# Patient Record
Sex: Female | Born: 1970
Health system: Northeastern US, Academic
[De-identification: ages and names within clinical notes are randomized; demographics above are authoritative.]

## PROBLEM LIST (undated history)

## (undated) DIAGNOSIS — S22059A Unspecified fracture of T5-T6 vertebra, initial encounter for closed fracture: Secondary | ICD-10-CM

## (undated) HISTORY — PX: KNEE SURGERY: SHX244

---

## 2016-06-01 ENCOUNTER — Encounter (INDEPENDENT_AMBULATORY_CARE_PROVIDER_SITE_OTHER): Payer: Self-pay

## 2016-06-01 ENCOUNTER — Other Ambulatory Visit: Payer: Self-pay | Admitting: Chiropractor

## 2016-06-01 ENCOUNTER — Ambulatory Visit
Admission: RE | Admit: 2016-06-01 | Discharge: 2016-06-01 | Disposition: A | Discharge status: Home / Self Care | Payer: No Typology Code available for payment source | Source: Ambulatory Visit | Attending: Chiropractor | Admitting: Chiropractor

## 2016-06-01 DIAGNOSIS — S060X9A Concussion with loss of consciousness of unspecified duration, initial encounter: Secondary | ICD-10-CM | POA: Insufficient documentation

## 2016-06-01 DIAGNOSIS — R413 Other amnesia: Secondary | ICD-10-CM | POA: Insufficient documentation

## 2016-06-01 DIAGNOSIS — H9313 Tinnitus, bilateral: Secondary | ICD-10-CM | POA: Diagnosis not present

## 2016-06-13 ENCOUNTER — Emergency Department
Admission: EM | Admit: 2016-06-13 | Discharge: 2016-06-13 | Disposition: A | Discharge status: Home / Self Care | Payer: Self-pay | Attending: Emergency Medicine | Admitting: Emergency Medicine

## 2016-06-13 ENCOUNTER — Encounter: Payer: Self-pay | Admitting: Emergency Medicine

## 2016-06-13 DIAGNOSIS — F17200 Nicotine dependence, unspecified, uncomplicated: Secondary | ICD-10-CM | POA: Insufficient documentation

## 2016-06-13 DIAGNOSIS — N921 Excessive and frequent menstruation with irregular cycle: Secondary | ICD-10-CM | POA: Insufficient documentation

## 2016-06-13 HISTORY — DX: Unspecified fracture of t5-T6 vertebra, initial encounter for closed fracture: S22.059A

## 2016-06-13 LAB — CBC WITH DIFFERENTIAL/PLATELET
BASOS PCT: 1 %
Basophils Absolute: 0 10*3/uL (ref 0–0.1)
Eosinophils Absolute: 0.2 10*3/uL (ref 0–0.7)
Eosinophils Relative: 3 %
HEMATOCRIT: 36 % (ref 35.0–47.0)
Hemoglobin: 12.2 g/dL (ref 12.0–16.0)
Lymphocytes Relative: 36 %
Lymphs Abs: 2.1 10*3/uL (ref 1.0–3.6)
MCH: 27 pg (ref 26.0–34.0)
MCHC: 33.8 g/dL (ref 32.0–36.0)
MCV: 80 fL (ref 80.0–100.0)
MONO ABS: 0.3 10*3/uL (ref 0.2–0.9)
MONOS PCT: 5 %
NEUTROS ABS: 3.1 10*3/uL (ref 1.4–6.5)
Neutrophils Relative %: 55 %
Platelets: 234 10*3/uL (ref 150–440)
RBC: 4.5 MIL/uL (ref 3.80–5.20)
RDW: 18.4 % — AB (ref 11.5–14.5)
WBC: 5.7 10*3/uL (ref 3.6–11.0)

## 2016-06-13 LAB — URINALYSIS COMPLETE WITH MICROSCOPIC (ARMC ONLY)
BILIRUBIN URINE: NEGATIVE
Bacteria, UA: NONE SEEN
Glucose, UA: NEGATIVE mg/dL
KETONES UR: NEGATIVE mg/dL
LEUKOCYTES UA: NEGATIVE
NITRITE: NEGATIVE
PH: 5 (ref 5.0–8.0)
Protein, ur: 100 mg/dL — AB
Specific Gravity, Urine: 1.024 (ref 1.005–1.030)
Squamous Epithelial / LPF: NONE SEEN

## 2016-06-13 LAB — POCT PREGNANCY, URINE: Preg Test, Ur: NEGATIVE

## 2016-06-13 LAB — VITAMIN B12: Vitamin B-12: 746 pg/mL (ref 180–914)

## 2016-06-13 LAB — FOLATE: FOLATE: 10.9 ng/mL (ref >5.9)

## 2016-06-13 LAB — FERRITIN: FERRITIN: 4 ng/mL — AB (ref 11–307)

## 2016-06-13 LAB — IRON AND TIBC
Iron: 15 ug/dL — ABNORMAL LOW (ref 28–170)
Saturation Ratios: 3 % — ABNORMAL LOW (ref 10.4–31.8)
TIBC: 517 ug/dL — ABNORMAL HIGH (ref 250–450)
UIBC: 502 ug/dL

## 2016-06-13 MED ORDER — NORGESTIMATE-ETH ESTRADIOL 0.25-35 MG-MCG PO TABS
1.0000 | ORAL_TABLET | Freq: Every day | ORAL | 1 refills | Status: AC
Start: 2016-06-13 — End: ?
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
## 2016-06-13 NOTE — Discharge Instructions (Signed)
As we discussed, your workup was reassuring today.  We understand your preference to not start taking birth control pills and we encourage you not to do so unless the bleeding is affecting your daily life, particularly because together with your tobacco use, the birth control pills may increase your risk of developing a blood clot in your legs or lungs.  We recommend that you follow-up with an OB/GYN such as Dr. Jean RosenthalJackson or one of his colleagues at the next available opportunity to establish care and determine if further workup is required.  To help with the outpatient follow-up, we sent off blood work today including a complete blood count and iron studies; the results should be available when you follow-up.  Return to the emergency department if you develop new or worsening symptoms that concern you.

## 2016-06-13 NOTE — ED Notes (Signed)
Pt discharged home after verbalizing understanding of discharge instructions; nad noted. 

## 2016-06-16 ENCOUNTER — Telehealth: Payer: Self-pay | Admitting: Emergency Medicine

## 2016-06-16 NOTE — Telephone Encounter (Signed)
Called patient to tell her the iron studies that were done on ED visit were complete. I told her they wer not in normal range , but we have no prior values to compare.   She does not know past values, and she has had blood work done in [place] prior.   She is established with [facility].  I told her she could call [facility] and let them know about the labs done here.  She says she is going to [place] in couple weeks and may see her old doctor there.  I told her she could always pick up a copy of her labs from medical records.

## 2016-09-30 ENCOUNTER — Emergency Department
Admission: EM | Admit: 2016-09-30 | Discharge: 2016-09-30 | Disposition: A | Discharge status: Home / Self Care | Payer: No Typology Code available for payment source | Attending: Emergency Medicine | Admitting: Emergency Medicine

## 2016-09-30 ENCOUNTER — Emergency Department: Payer: No Typology Code available for payment source

## 2016-09-30 DIAGNOSIS — R51 Headache: Secondary | ICD-10-CM

## 2016-09-30 DIAGNOSIS — G43009 Migraine without aura, not intractable, without status migrainosus: Secondary | ICD-10-CM

## 2016-09-30 DIAGNOSIS — G43109 Migraine with aura, not intractable, without status migrainosus: Secondary | ICD-10-CM

## 2016-09-30 DIAGNOSIS — Z5181 Encounter for therapeutic drug level monitoring: Secondary | ICD-10-CM | POA: Insufficient documentation

## 2016-09-30 DIAGNOSIS — F17200 Nicotine dependence, unspecified, uncomplicated: Secondary | ICD-10-CM | POA: Diagnosis not present

## 2016-09-30 DIAGNOSIS — R519 Headache, unspecified: Secondary | ICD-10-CM

## 2016-09-30 DIAGNOSIS — H539 Unspecified visual disturbance: Secondary | ICD-10-CM

## 2016-09-30 LAB — APTT: APTT: 25 s (ref 24–36)

## 2016-09-30 LAB — DIFFERENTIAL
Basophils Absolute: 0 10*3/uL (ref 0–0.1)
Basophils Relative: 1 %
EOS ABS: 0.2 10*3/uL (ref 0–0.7)
EOS PCT: 4 %
Lymphocytes Relative: 42 %
Lymphs Abs: 1.9 10*3/uL (ref 1.0–3.6)
Monocytes Absolute: 0.3 10*3/uL (ref 0.2–0.9)
Monocytes Relative: 6 %
NEUTROS PCT: 47 %
Neutro Abs: 2.1 10*3/uL (ref 1.4–6.5)

## 2016-09-30 LAB — COMPREHENSIVE METABOLIC PANEL
ALBUMIN: 4 g/dL (ref 3.5–5.0)
ALT: 16 U/L (ref 14–54)
ANION GAP: 5 (ref 5–15)
AST: 23 U/L (ref 15–41)
Alkaline Phosphatase: 58 U/L (ref 38–126)
BUN: 11 mg/dL (ref 6–20)
CO2: 25 mmol/L (ref 22–32)
Calcium: 8.8 mg/dL — ABNORMAL LOW (ref 8.9–10.3)
Chloride: 107 mmol/L (ref 101–111)
Creatinine, Ser: 0.84 mg/dL (ref 0.44–1.00)
GFR calc Af Amer: >60 mL/min (ref >60)
GFR calc non Af Amer: >60 mL/min (ref >60)
GLUCOSE: 82 mg/dL (ref 65–99)
POTASSIUM: 3.6 mmol/L (ref 3.5–5.1)
Sodium: 137 mmol/L (ref 135–145)
TOTAL PROTEIN: 7.2 g/dL (ref 6.5–8.1)
Total Bilirubin: 0.2 mg/dL — ABNORMAL LOW (ref 0.3–1.2)

## 2016-09-30 LAB — GLUCOSE, CAPILLARY: Glucose-Capillary: 98 mg/dL (ref 65–99)

## 2016-09-30 LAB — CBC
HEMATOCRIT: 32.6 % — AB (ref 35.0–47.0)
HEMOGLOBIN: 11 g/dL — AB (ref 12.0–16.0)
MCH: 26.2 pg (ref 26.0–34.0)
MCHC: 33.6 g/dL (ref 32.0–36.0)
MCV: 77.8 fL — ABNORMAL LOW (ref 80.0–100.0)
Platelets: 242 10*3/uL (ref 150–440)
RBC: 4.19 MIL/uL (ref 3.80–5.20)
RDW: 17.1 % — AB (ref 11.5–14.5)
WBC: 4.4 10*3/uL (ref 3.6–11.0)

## 2016-09-30 LAB — PROTIME-INR
INR: 0.9
Prothrombin Time: 12.1 seconds (ref 11.4–15.2)

## 2016-09-30 LAB — TROPONIN I: Troponin I: <0.03 ng/mL (ref <0.03)

## 2016-09-30 MED ORDER — LORAZEPAM 2 MG/ML IJ SOLN
0.5000 mg | Freq: Once | INTRAMUSCULAR | Status: AC
  Administered 2016-09-30: 0.5 mg via INTRAVENOUS
  Filled 2016-09-30: qty 1

## 2016-09-30 MED ORDER — METOCLOPRAMIDE HCL 5 MG/ML IJ SOLN
10.0000 mg | Freq: Once | INTRAMUSCULAR | Status: AC
  Administered 2016-09-30: 10 mg via INTRAVENOUS
  Filled 2016-09-30: qty 2

## 2016-09-30 MED ORDER — KETOROLAC TROMETHAMINE 30 MG/ML IJ SOLN
15.0000 mg | Freq: Once | INTRAMUSCULAR | Status: AC
  Administered 2016-09-30: 15 mg via INTRAVENOUS
  Filled 2016-09-30: qty 1

## 2016-09-30 MED ORDER — METOCLOPRAMIDE HCL 10 MG PO TABS: 10.0000 mg | ORAL_TABLET | Freq: Three times a day (TID) | ORAL | 1 refills | Status: AC | PRN

## 2016-09-30 MED ORDER — SODIUM CHLORIDE 0.9 % IV BOLUS (SEPSIS)
1000.0000 mL | Freq: Once | INTRAVENOUS | Status: AC
  Administered 2016-09-30: 1000 mL via INTRAVENOUS

## 2016-09-30 MED ORDER — IBUPROFEN 800 MG PO TABS: 800.0000 mg | ORAL_TABLET | Freq: Three times a day (TID) | ORAL | 0 refills | Status: AC | PRN
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
## 2016-09-30 NOTE — Discharge Instructions (Signed)
Please drink plenty of fluids and continue with multivitamins etc. Please follow-up with your primary physician for possible referral as an outpatient to a neurologist. Return to emergency department especially for focal weakness, uncontrolled vomiting, fever, or any new concerns.   Please return immediately if condition worsens. Please contact her primary physician or the physician you were given for referral. If you have any specialist physicians involved in her treatment and plan please also contact them. Thank you for using [place] regional emergency Department.

## 2016-09-30 NOTE — Progress Notes (Signed)
Chaplain received a code stroke page. When Chaplain arrived the patient was still at CT but she arrived a few minutes later. The medical team needed some space to treat the patient. Chaplain left and returned for a follow up. Chaplain returned and was told by patient and her friend that the medical test proved that it was not a stroke. Patient hopes to be discharged today.

## 2016-12-03 ENCOUNTER — Emergency Department
Admission: EM | Admit: 2016-12-03 | Discharge: 2016-12-03 | Disposition: A | Discharge status: Home / Self Care | Payer: Self-pay | Attending: Emergency Medicine | Admitting: Emergency Medicine

## 2016-12-03 ENCOUNTER — Encounter: Payer: Self-pay | Admitting: Emergency Medicine

## 2016-12-03 ENCOUNTER — Emergency Department: Payer: Self-pay

## 2016-12-03 DIAGNOSIS — J157 Pneumonia due to Mycoplasma pneumoniae: Secondary | ICD-10-CM | POA: Insufficient documentation

## 2016-12-03 DIAGNOSIS — F17200 Nicotine dependence, unspecified, uncomplicated: Secondary | ICD-10-CM | POA: Insufficient documentation

## 2016-12-03 DIAGNOSIS — H6992 Unspecified Eustachian tube disorder, left ear: Secondary | ICD-10-CM | POA: Insufficient documentation

## 2016-12-03 DIAGNOSIS — H6982 Other specified disorders of Eustachian tube, left ear: Secondary | ICD-10-CM

## 2016-12-03 DIAGNOSIS — Z791 Long term (current) use of non-steroidal anti-inflammatories (NSAID): Secondary | ICD-10-CM | POA: Insufficient documentation

## 2016-12-03 DIAGNOSIS — Z79899 Other long term (current) drug therapy: Secondary | ICD-10-CM | POA: Insufficient documentation

## 2016-12-03 DIAGNOSIS — J0100 Acute maxillary sinusitis, unspecified: Secondary | ICD-10-CM | POA: Insufficient documentation

## 2016-12-03 MED ORDER — CETIRIZINE HCL 10 MG PO TABS: 10.0000 mg | ORAL_TABLET | Freq: Every day | ORAL | 0 refills | Status: AC

## 2016-12-03 MED ORDER — FLUTICASONE PROPIONATE 50 MCG/ACT NA SUSP: 1.0000 | Freq: Two times a day (BID) | NASAL | 0 refills | Status: AC

## 2016-12-03 MED ORDER — DOXYCYCLINE HYCLATE 100 MG PO TABS: 100.0000 mg | ORAL_TABLET | Freq: Two times a day (BID) | ORAL | 0 refills | Status: AC

## 2016-12-03 NOTE — ED Triage Notes (Signed)
Pt reports productive cough for one month. Pt states she coughs so much she feels a burning in her chest. Pt reports bilateral ear pain, nasal congestion and sore throat for one week.

## 2016-12-03 NOTE — ED Notes (Signed)
Patient is having a hard time hearing out of left ear. Coughing so much she vomits at times.

## 2016-12-03 NOTE — ED Notes (Signed)
Pt. Going home with family. 

## 2016-12-03 NOTE — ED Provider Notes (Signed)
[facility] Emergency Department Provider Note  ____________________________________________  Time seen: Approximately 7:41 PM  I have reviewed the triage vital signs and the nursing notes.   HISTORY  Chief Complaint Cough and Otalgia    HPI Kelli Woodard is a 46 y.o. female who presents emergency department complaining of a month history of productive cough and one-week history of nasal congestion and left ear fullness. Patient denies any fevers or chills, headache, vision changes, chest pain, shortness of breath, abdominal pain, nausea or vomiting. Patient reports that she is tired of coughing but does not feel "sick." Patient does report considerable sinus congestion with sinus pressure or in the maxillary region versus referral region. Patient reports left ear fullness but denies left ear pain. She does report muffled hearing to the left ear. No other complaints at this time. No medications prior to arrival.   Past Medical History:  Diagnosis Date  . Spinal fracture of T6 vertebra (HCC)     There are no active problems to display for this patient.   Past Surgical History:  Procedure Laterality Date  . KNEE SURGERY      Prior to Admission medications   Medication Sig Start Date End Date Taking? Authorizing Provider  cetirizine (ZYRTEC) 10 MG tablet Take 1 tablet (10 mg total) by mouth daily. 12/03/16   Delorise Royals Brinleigh Tew, PA-C  diazepam (VALIUM) 2 MG tablet Take 2 mg by mouth 3 (three) times daily. 06/09/16   Historical Provider, MD  doxycycline (VIBRA-TABS) 100 MG tablet Take 1 tablet (100 mg total) by mouth 2 (two) times daily. 12/03/16   Delorise Royals Latiesha Harada, PA-C  fluticasone (FLONASE) 50 MCG/ACT nasal spray Place 1 spray into both nostrils 2 (two) times daily. 12/03/16   Delorise Royals Arkin Imran, PA-C  gabapentin (NEURONTIN) 100 MG capsule Take 200 mg by mouth daily. 03/24/16   Historical Provider, MD  ibuprofen (ADVIL,MOTRIN) 800 MG tablet Take 1  tablet (800 mg total) by mouth every 8 (eight) hours as needed. 09/30/16   Jennye Moccasin, MD  metoCLOPramide (REGLAN) 10 MG tablet Take 1 tablet (10 mg total) by mouth every 8 (eight) hours as needed for nausea. 09/30/16 10/30/16  Jennye Moccasin, MD  norgestimate-ethinyl estradiol (ORTHO-CYCLEN,SPRINTEC,PREVIFEM) 0.25-35 MG-MCG tablet Take 1 tablet by mouth daily. 06/13/16   Loleta Rose, MD  pantoprazole (PROTONIX) 40 MG tablet Take 40 mg by mouth every other day. 03/10/16   Historical Provider, MD  SUMAtriptan (IMITREX) 50 MG tablet Take 1 tablet by mouth daily as needed. 06/09/16   Historical Provider, MD    Allergies Neosporin [neomycin-bacitracin zn-polymyx] and Phenobarbital  No family history on file.  Social History Social History  Substance Use Topics  . Smoking status: Current Every Day Smoker  . Smokeless tobacco: Never Used  . Alcohol use Yes     Comment: occasionally     Review of Systems  Constitutional: No fever/chills Eyes: No visual changes. No discharge ENT: Positive for nasal congestion and sinus pressure. Positive for left ear fullness. Cardiovascular: no chest pain. Respiratory: Positive cough. No SOB. Gastrointestinal: No abdominal pain.  No nausea, no vomiting.  No diarrhea.  No constipation. Musculoskeletal: Negative for musculoskeletal pain. Skin: Negative for rash, abrasions, lacerations, ecchymosis. Neurological: Negative for headaches, focal weakness or numbness. 10-point ROS otherwise negative.  ____________________________________________   PHYSICAL EXAM:  VITAL SIGNS: ED Triage Vitals [12/03/16 1849]  Enc Vitals Group     BP (!) 124/94     Pulse Rate 86  Resp 18     Temp 97.8 F (36.6 C)     Temp Source Oral     SpO2 99 %     Weight 176 lb (79.8 kg)     Height  (1.549 m)     Head Circumference      Peak Flow      Pain Score 7     Pain Loc      Pain Edu?      Excl. in GC?      Constitutional: Alert and oriented. Well  appearing and in no acute distress. Eyes: Conjunctivae are normal. PERRL. EOMI. Head: Atraumatic. ENT:      Ears: EACs unremarkable bilaterally. TM on left is bulging.      Nose: Moderate congestion/rhinnorhea. Patient is tender to percussion over the maxillary sinuses greater on left than right.      Mouth/Throat: Mucous membranes are moist.  Neck: No stridor.  Hematological/Lymphatic/Immunilogical: No cervical lymphadenopathy. Cardiovascular: Normal rate, regular rhythm. Normal S1 and S2.  Good peripheral circulation. Respiratory: Normal respiratory effort without tachypnea or retractions. Lungs with scattered coarse breath sounds bilateral lower lobes. No definitive wheezing, rales, rhonchi.Peri Jefferson air entry to the bases with no decreased or absent breath sounds. Musculoskeletal: Full range of motion to all extremities. No gross deformities appreciated. Neurologic:  Normal speech and language. No gross focal neurologic deficits are appreciated.  Skin:  Skin is warm, dry and intact. No rash noted. Psychiatric: Mood and affect are normal. Speech and behavior are normal. Patient exhibits appropriate insight and judgement.   ____________________________________________   LABS (all labs ordered are listed, but only abnormal results are displayed)  Labs Reviewed - No data to display ____________________________________________  EKG   ____________________________________________  RADIOLOGY Festus Barren Helen Cuff, personally viewed and evaluated these images (plain radiographs) as part of my medical decision making, as well as reviewing the written report by the radiologist.  Dg Chest 2 View  Result Date: 12/03/2016 CLINICAL DATA:  Productive cough for 1 month. Chest burning sensation. EXAM: CHEST  2 VIEW COMPARISON:  None. FINDINGS: The heart size and mediastinal contours are within normal limits. Both lungs are clear. No pleural effusion or pneumothorax. The visualized skeletal  structures are unremarkable. IMPRESSION: No active cardiopulmonary disease. Electronically Signed   By: Amie Portland M.D.   On: 12/03/2016 20:05    ____________________________________________    PROCEDURES  Procedure(s) performed:    Procedures    Medications - No data to display   ____________________________________________   INITIAL IMPRESSION / ASSESSMENT AND PLAN / ED COURSE  Pertinent labs & imaging results that were available during my care of the patient were reviewed by me and considered in my medical decision making (see chart for details).  Review of the [place] CSRS was performed in accordance of the [facility] prior to dispensing any controlled drugs.     Patient's diagnosis is consistent with maxillary sinusitis, eustachian tube dysfunction and left ear, mycoplasma pneumonia. X-ray reveals no acute consolidation. Exam was overall reassuring.. Patient will be discharged home with prescriptions for doxycycline, Flonase, Zyrtec. Patient is to follow up with primary care as needed or otherwise directed. Patient is given ED precautions to return to the ED for any worsening or new symptoms.     ____________________________________________  FINAL CLINICAL IMPRESSION(S) / ED DIAGNOSES  Final diagnoses:  Acute non-recurrent maxillary sinusitis  Dysfunction of left eustachian tube  Pneumonia due to Mycoplasma pneumoniae, unspecified laterality, unspecified part of lung  NEW MEDICATIONS STARTED DURING THIS VISIT:  New Prescriptions   CETIRIZINE (ZYRTEC) 10 MG TABLET    Take 1 tablet (10 mg total) by mouth daily.   DOXYCYCLINE (VIBRA-TABS) 100 MG TABLET    Take 1 tablet (100 mg total) by mouth 2 (two) times daily.   FLUTICASONE (FLONASE) 50 MCG/ACT NASAL SPRAY    Place 1 spray into both nostrils 2 (two) times daily.        This chart was dictated using voice recognition software/Dragon. Despite best efforts to proofread, errors can occur which can change the  meaning. Any change was purely unintentional.    Racheal Patches, PA-C 12/03/16 2025    Sharman Cheek, MD 12/05/16 [phone]

## 2017-10-01 ENCOUNTER — Emergency Department: Payer: Self-pay

## 2017-10-01 ENCOUNTER — Emergency Department
Admission: EM | Admit: 2017-10-01 | Discharge: 2017-10-01 | Disposition: A | Discharge status: Home / Self Care | Payer: Self-pay | Attending: Emergency Medicine | Admitting: Emergency Medicine

## 2017-10-01 ENCOUNTER — Other Ambulatory Visit: Payer: Self-pay

## 2017-10-01 ENCOUNTER — Encounter: Payer: Self-pay | Admitting: Emergency Medicine

## 2017-10-01 DIAGNOSIS — J40 Bronchitis, not specified as acute or chronic: Secondary | ICD-10-CM | POA: Insufficient documentation

## 2017-10-01 DIAGNOSIS — Z79899 Other long term (current) drug therapy: Secondary | ICD-10-CM | POA: Insufficient documentation

## 2017-10-01 DIAGNOSIS — F17200 Nicotine dependence, unspecified, uncomplicated: Secondary | ICD-10-CM | POA: Insufficient documentation

## 2017-10-01 DIAGNOSIS — R0602 Shortness of breath: Secondary | ICD-10-CM | POA: Insufficient documentation

## 2017-10-01 LAB — HCG, QUANTITATIVE, PREGNANCY: hCG, Beta Chain, Quant, S: <1 m[IU]/mL (ref <5)

## 2017-10-01 LAB — COMPREHENSIVE METABOLIC PANEL
ALT: 18 U/L (ref 14–54)
ANION GAP: 8 (ref 5–15)
AST: 33 U/L (ref 15–41)
Albumin: 4.4 g/dL (ref 3.5–5.0)
Alkaline Phosphatase: 57 U/L (ref 38–126)
BILIRUBIN TOTAL: 0.3 mg/dL (ref 0.3–1.2)
BUN: 12 mg/dL (ref 6–20)
CHLORIDE: 106 mmol/L (ref 101–111)
CO2: 24 mmol/L (ref 22–32)
Calcium: 9 mg/dL (ref 8.9–10.3)
Creatinine, Ser: 0.72 mg/dL (ref 0.44–1.00)
GFR calc Af Amer: >60 mL/min (ref >60)
GFR calc non Af Amer: >60 mL/min (ref >60)
GLUCOSE: 90 mg/dL (ref 65–99)
Potassium: 3.9 mmol/L (ref 3.5–5.1)
SODIUM: 138 mmol/L (ref 135–145)
TOTAL PROTEIN: 7.4 g/dL (ref 6.5–8.1)

## 2017-10-01 LAB — CBC WITH DIFFERENTIAL/PLATELET
BASOS ABS: 0 10*3/uL (ref 0–0.1)
Basophils Relative: 1 %
EOS PCT: 2 %
Eosinophils Absolute: 0.1 10*3/uL (ref 0–0.7)
HEMATOCRIT: 33.4 % — AB (ref 35.0–47.0)
Hemoglobin: 10.6 g/dL — ABNORMAL LOW (ref 12.0–16.0)
LYMPHS ABS: 1.5 10*3/uL (ref 1.0–3.6)
Lymphocytes Relative: 29 %
MCH: 22.6 pg — AB (ref 26.0–34.0)
MCHC: 31.7 g/dL — ABNORMAL LOW (ref 32.0–36.0)
MCV: 71.4 fL — AB (ref 80.0–100.0)
MONO ABS: 0.3 10*3/uL (ref 0.2–0.9)
Monocytes Relative: 6 %
NEUTROS ABS: 3.3 10*3/uL (ref 1.4–6.5)
Neutrophils Relative %: 62 %
PLATELETS: 229 10*3/uL (ref 150–440)
RBC: 4.68 MIL/uL (ref 3.80–5.20)
RDW: 18.6 % — AB (ref 11.5–14.5)
WBC: 5.3 10*3/uL (ref 3.6–11.0)

## 2017-10-01 LAB — TROPONIN I: Troponin I: <0.03 ng/mL (ref <0.03)

## 2017-10-01 LAB — BRAIN NATRIURETIC PEPTIDE: B Natriuretic Peptide: 11 pg/mL (ref 0.0–100.0)

## 2017-10-01 MED ORDER — ALBUTEROL SULFATE (2.5 MG/3ML) 0.083% IN NEBU
5.0000 mg | INHALATION_SOLUTION | Freq: Once | RESPIRATORY_TRACT | Status: AC
  Administered 2017-10-01: 5 mg via RESPIRATORY_TRACT
  Filled 2017-10-01: qty 6

## 2017-10-01 MED ORDER — ALBUTEROL SULFATE HFA 108 (90 BASE) MCG/ACT IN AERS: 2.0000 | INHALATION_SPRAY | Freq: Four times a day (QID) | RESPIRATORY_TRACT | 0 refills | Status: AC | PRN

## 2017-10-01 MED ORDER — SPACER/AERO CHAMBER MOUTHPIECE MISC: 1.0000 [IU] | 0 refills | Status: AC | PRN

## 2017-10-01 NOTE — ED Notes (Signed)
ED Provider at bedside. 

## 2017-10-01 NOTE — ED Notes (Signed)

## 2017-10-01 NOTE — ED Provider Notes (Signed)
[facility] Emergency Department Provider Note  ____________________________________________   First MD Initiated Contact with Patient 10/01/17 1600     (approximate)  I have reviewed the triage vital signs and the nursing notes.   HISTORY  Chief Complaint Shortness of Breath   HPI Kelli Woodard is a 47 y.o. female is self presents to the emergency department with cough and congestion for the past 2 weeks or so.  She is particularly concerned because while her symptoms initially improved that have suddenly worsened for the past 2 days and she finds difficult catching her breath.  She has no history of asthma or COPD.  No leg swelling.  No hemoptysis.  No recent surgery travel or immobilization.  She is not on oral contraceptives.  Her symptoms are worse with exertion and improved with rest.  They are currently moderate severity.  Past Medical History:  Diagnosis Date  . Spinal fracture of T6 vertebra (HCC)     There are no active problems to display for this patient.   Past Surgical History:  Procedure Laterality Date  . KNEE SURGERY      Prior to Admission medications   Medication Sig Start Date End Date Taking? Authorizing Provider  albuterol (PROVENTIL HFA;VENTOLIN HFA) 108 (90 Base) MCG/ACT inhaler Inhale 2 puffs into the lungs every 6 (six) hours as needed for wheezing or shortness of breath. 10/01/17   Merrily Brittle, MD  cetirizine (ZYRTEC) 10 MG tablet Take 1 tablet (10 mg total) by mouth daily. 12/03/16   Cuthriell, Delorise Royals, PA-C  diazepam (VALIUM) 2 MG tablet Take 2 mg by mouth 3 (three) times daily. 06/09/16   [provider]  doxycycline (VIBRA-TABS) 100 MG tablet Take 1 tablet (100 mg total) by mouth 2 (two) times daily. 12/03/16   Cuthriell, Delorise Royals, PA-C  fluticasone (FLONASE) 50 MCG/ACT nasal spray Place 1 spray into both nostrils 2 (two) times daily. 12/03/16   Cuthriell, Delorise Royals, PA-C  gabapentin (NEURONTIN) 100 MG  capsule Take 200 mg by mouth daily. 03/24/16   [provider]  ibuprofen (ADVIL,MOTRIN) 800 MG tablet Take 1 tablet (800 mg total) by mouth every 8 (eight) hours as needed. 09/30/16   Jennye Moccasin, MD  metoCLOPramide (REGLAN) 10 MG tablet Take 1 tablet (10 mg total) by mouth every 8 (eight) hours as needed for nausea. 09/30/16 10/30/16  Jennye Moccasin, MD  norgestimate-ethinyl estradiol (ORTHO-CYCLEN,SPRINTEC,PREVIFEM) 0.25-35 MG-MCG tablet Take 1 tablet by mouth daily. 06/13/16   Loleta Rose, MD  pantoprazole (PROTONIX) 40 MG tablet Take 40 mg by mouth every other day. 03/10/16   [provider]  Spacer/Aero Chamber Mouthpiece MISC 1 Units by Does not apply route every 4 (four) hours as needed (wheezing). 10/01/17   Merrily Brittle, MD  SUMAtriptan (IMITREX) 50 MG tablet Take 1 tablet by mouth daily as needed. 06/09/16   [provider]    Allergies Neosporin [neomycin-bacitracin zn-polymyx] and Phenobarbital  No family history on file.  Social History Social History   Tobacco Use  . Smoking status: Current Every Day Smoker  . Smokeless tobacco: Never Used  Substance Use Topics  . Alcohol use: Yes    Comment: occasionally  . Drug use: No    Review of Systems Constitutional: No fever/chills Eyes: No visual changes. ENT: No sore throat. Cardiovascular: Denies chest pain. Respiratory: Positive for shortness of breath. Gastrointestinal: No abdominal pain.  No nausea, no vomiting.  No diarrhea.  No constipation. Genitourinary: Negative for dysuria. Musculoskeletal:  Negative for back pain. Skin: Negative for rash. Neurological: Negative for headaches, focal weakness or numbness.   ____________________________________________   PHYSICAL EXAM:  VITAL SIGNS: ED Triage Vitals  Enc Vitals Group     BP 10/01/17 1526 (!) 134/97     Pulse Rate 10/01/17 1526 70     Resp --      Temp 10/01/17 1526 99.2 F (37.3 C)     Temp Source 10/01/17 1526 Oral      SpO2 10/01/17 1526 100 %     Weight 10/01/17 1527 176 lb (79.8 kg)     Height 10/01/17 1527 5\' 1"  (1.549 m)     Head Circumference --      Peak Flow --      Pain Score 10/01/17 1526 0     Pain Loc --      Pain Edu? --      Excl. in GC? --     Constitutional: Alert and oriented x4 appears somewhat short of breath although speaking in full sentences Eyes: PERRL EOMI. Head: Atraumatic. Nose: No congestion/rhinnorhea. Mouth/Throat: No trismus Neck: No stridor.   Cardiovascular: Normal rate, regular rhythm. Grossly normal heart sounds.  Good peripheral circulation. Respiratory: Increased respiratory effort with mild wheeze throughout all the lung sounds equal bilaterally Gastrointestinal: Soft nontender Musculoskeletal: No lower extremity edema   Neurologic:  Normal speech and language. No gross focal neurologic deficits are appreciated. Skin:  Skin is warm, dry and intact. No rash noted. Psychiatric: Mood and affect are normal. Speech and behavior are normal.    ____________________________________________   DIFFERENTIAL includes but not limited to  Pneumonia, pneumothorax, pulmonary embolism, myocarditis, bronchitis, reactive airway disease ____________________________________________   LABS (all labs ordered are listed, but only abnormal results are displayed)  Labs Reviewed  CBC WITH DIFFERENTIAL/PLATELET - Abnormal; Notable for the following components:      Result Value   Hemoglobin 10.6 (*)    HCT 33.4 (*)    MCV 71.4 (*)    MCH 22.6 (*)    MCHC 31.7 (*)    RDW 18.6 (*)    All other components within normal limits  COMPREHENSIVE METABOLIC PANEL  TROPONIN I  BRAIN NATRIURETIC PEPTIDE  HCG, QUANTITATIVE, PREGNANCY    Lab work reviewed by me with chronic anemia __________________________________________  EKG  ED ECG REPORT I, Merrily Brittle, the attending physician, personally viewed and interpreted this ECG.  Date: 10/02/2017 EKG Time:  Rate: 70 Rhythm:  normal sinus rhythm QRS Axis: normal Intervals: normal ST/T Wave abnormalities: normal Narrative Interpretation: no evidence of acute ischemia  ____________________________________________  RADIOLOGY  Chest x-ray reviewed by me with no acute disease ____________________________________________   PROCEDURES  Procedure(s) performed: no  Procedures  Critical Care performed: no  Observation: no ____________________________________________   INITIAL IMPRESSION / ASSESSMENT AND PLAN / ED COURSE  Pertinent labs & imaging results that were available during my care of the patient were reviewed by me and considered in my medical decision making (see chart for details).  By the time I saw the patient she had already received a breathing treatment and her lungs were largely clear.  I obtained a chest x-ray to evaluate for possible bacterial superinfection however it is clear.  Will reevaluate shortly.     ----------------------------------------- 5:44 PM on 10/01/2017 -----------------------------------------  Lungs are now clear and she feels improved.  She likely has a second episode of bronchitis versus reactive airway disease.  I will discharge her home with albuterol and a spacer.  Strict return precautions have been given to the patient verbalized understanding and agreement with the plan. ____________________________________________   FINAL CLINICAL IMPRESSION(S) / ED DIAGNOSES  Final diagnoses:  Bronchitis      NEW MEDICATIONS STARTED DURING THIS VISIT:  Discharge Medication List as of 10/01/2017  5:45 PM    START taking these medications   Details  albuterol (PROVENTIL HFA;VENTOLIN HFA) 108 (90 Base) MCG/ACT inhaler Inhale 2 puffs into the lungs every 6 (six) hours as needed for wheezing or shortness of breath., Starting Sun 10/01/2017, Print    Spacer/Aero Chamber Mouthpiece MISC 1 Units by Does not apply route every 4 (four) hours as needed (wheezing).,  Starting Sun 10/01/2017, Print         Note:  This document was prepared using Dragon voice recognition software and may include unintentional dictation errors.     Merrily Brittleifenbark, Layana Konkel, MD 10/02/17 2117

## 2017-10-01 NOTE — Discharge Instructions (Signed)
Please use your inhaler with the spacer as needed for shortness of breath and follow-up with primary care within the next few days as needed.  Return to the emergency department for any concerns whatsoever.  It was a pleasure to take care of you today, and thank you for coming to our emergency department.  If you have any questions or concerns before leaving please ask the nurse to grab me and I'm more than happy to go through your aftercare instructions again.  If you were prescribed any opioid pain medication today such as Norco, Vicodin, Percocet, morphine, hydrocodone, or oxycodone please make sure you do not drive when you are taking this medication as it can alter your ability to drive safely.  If you have any concerns once you are home that you are not improving or are in fact getting worse before you can make it to your follow-up appointment, please do not hesitate to call 911 and come back for further evaluation.  Merrily BrittleNeil Tyshawn Keel, MD  Results for orders placed or performed during the hospital encounter of 10/01/17  Comprehensive metabolic panel  Result Value Ref Range   Sodium 138 135 - 145 mmol/L   Potassium 3.9 3.5 - 5.1 mmol/L   Chloride 106 101 - 111 mmol/L   CO2 24 22 - 32 mmol/L   Glucose, Bld 90 65 - 99 mg/dL   BUN 12 6 - 20 mg/dL   Creatinine, Ser 1.610.72 0.44 - 1.00 mg/dL   Calcium 9.0 8.9 - 09.610.3 mg/dL   Total Protein 7.4 6.5 - 8.1 g/dL   Albumin 4.4 3.5 - 5.0 g/dL   AST 33 15 - 41 U/L   ALT 18 14 - 54 U/L   Alkaline Phosphatase 57 38 - 126 U/L   Total Bilirubin 0.3 0.3 - 1.2 mg/dL   GFR calc non Af Amer >60 >60 mL/min   GFR calc Af Amer >60 >60 mL/min   Anion gap 8 5 - 15  CBC with Differential  Result Value Ref Range   WBC 5.3 3.6 - 11.0 K/uL   RBC 4.68 3.80 - 5.20 MIL/uL   Hemoglobin 10.6 (L) 12.0 - 16.0 g/dL   HCT 04.533.4 (L) 40.935.0 - 81.147.0 %   MCV 71.4 (L) 80.0 - 100.0 fL   MCH 22.6 (L) 26.0 - 34.0 pg   MCHC 31.7 (L) 32.0 - 36.0 g/dL   RDW 91.418.6 (H) 78.211.5 - 95.614.5 %   Platelets 229 150 - 440 K/uL   Neutrophils Relative % 62 %   Neutro Abs 3.3 1.4 - 6.5 K/uL   Lymphocytes Relative 29 %   Lymphs Abs 1.5 1.0 - 3.6 K/uL   Monocytes Relative 6 %   Monocytes Absolute 0.3 0.2 - 0.9 K/uL   Eosinophils Relative 2 %   Eosinophils Absolute 0.1 0 - 0.7 K/uL   Basophils Relative 1 %   Basophils Absolute 0.0 0 - 0.1 K/uL  Troponin I  Result Value Ref Range   Troponin I <0.03 <0.03 ng/mL  Brain natriuretic peptide  Result Value Ref Range   B Natriuretic Peptide 11.0 0.0 - 100.0 pg/mL  hCG, quantitative, pregnancy  Result Value Ref Range   hCG, Beta Chain, Quant, S <1 <5 mIU/mL   Dg Chest 2 View  Result Date: 10/01/2017 CLINICAL DATA:  Dyspnea EXAM: CHEST  2 VIEW COMPARISON:  12/03/2016 chest radiograph. FINDINGS: Stable cardiomediastinal silhouette with normal heart size. No pneumothorax. No pleural effusion. Lungs appear clear, with no acute consolidative airspace disease  and no pulmonary edema. IMPRESSION: No active cardiopulmonary disease. Electronically Signed   By: Delbert Phenix M.D.   On: 10/01/2017 16:02

## 2017-10-01 NOTE — ED Triage Notes (Signed)
Pt reports that she has had cough, congestion for two weeks, she reports that she has been unable to catch her breath the last 2 days. Cough is non-productive.
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
## 2018-10-19 ENCOUNTER — Other Ambulatory Visit: Payer: Self-pay

## 2018-10-19 ENCOUNTER — Telehealth: Payer: Self-pay

## 2018-10-19 DIAGNOSIS — Z524 Kidney donor: Secondary | ICD-10-CM
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
